# Patient Record
Sex: Male | Born: 1980 | Race: Black or African American | Hispanic: No | Marital: Single | Smoking: Current every day smoker
Health system: Southern US, Community
[De-identification: ages and names within clinical notes are randomized; demographics above are authoritative.]

---

## 2016-02-02 ENCOUNTER — Emergency Department (HOSPITAL_COMMUNITY): Payer: Self-pay

## 2016-02-02 ENCOUNTER — Encounter (HOSPITAL_COMMUNITY): Payer: Self-pay

## 2016-02-02 ENCOUNTER — Emergency Department (HOSPITAL_COMMUNITY)
Admission: EM | Admit: 2016-02-02 | Discharge: 2016-02-02 | Disposition: A | Discharge status: Home / Self Care | Payer: Self-pay | Attending: Emergency Medicine | Admitting: Emergency Medicine

## 2016-02-02 DIAGNOSIS — S63501A Unspecified sprain of right wrist, initial encounter: Secondary | ICD-10-CM | POA: Insufficient documentation

## 2016-02-02 DIAGNOSIS — Y9259 Other trade areas as the place of occurrence of the external cause: Secondary | ICD-10-CM | POA: Insufficient documentation

## 2016-02-02 DIAGNOSIS — M67431 Ganglion, right wrist: Secondary | ICD-10-CM | POA: Insufficient documentation

## 2016-02-02 DIAGNOSIS — X500XXA Overexertion from strenuous movement or load, initial encounter: Secondary | ICD-10-CM | POA: Insufficient documentation

## 2016-02-02 DIAGNOSIS — Y990 Civilian activity done for income or pay: Secondary | ICD-10-CM | POA: Insufficient documentation

## 2016-02-02 DIAGNOSIS — Y939 Activity, unspecified: Secondary | ICD-10-CM | POA: Insufficient documentation

## 2016-02-02 DIAGNOSIS — F17200 Nicotine dependence, unspecified, uncomplicated: Secondary | ICD-10-CM | POA: Insufficient documentation

## 2016-02-02 MED ORDER — MELOXICAM 7.5 MG PO TABS: 7.5000 mg | ORAL_TABLET | Freq: Every day | ORAL | 0 refills | Status: AC

## 2016-02-02 NOTE — ED Triage Notes (Signed)
Per Pt, Pt is coming from home with complaints of aching right wrist pain after about a month. Pt reports working at a [employer]. Denies known injury.

## 2016-02-02 NOTE — ED Notes (Signed)
Declined W/C at D/C and was escorted to lobby by RN. 

## 2016-02-02 NOTE — ED Provider Notes (Signed)
[facility]-EMERGENCY DEPT Provider Note   CSN: [id number] Arrival date & time: 02/02/16  1202  By signing my name below, I, Christy Sartorius, attest that this documentation has been prepared under the direction and in the presence of  [facility], PA-C. Electronically Signed: Christy Sartorius, ED Scribe. 02/02/16. 2:30 PM.  History   Chief Complaint Chief Complaint  Patient presents with  . Wrist Pain   The history is provided by the patient and medical records. No language interpreter was used.   HPI Comments:  Miguel Zhang is a 35 y.o. male who presents to the Emergency Department complaining of acute on chronic pain in his right wrist beginning 3 days ago.  He notes a raised area on his dorsal wrist that prevents full extension of the wrist.  He states that the swelling and pain has appeared intermittently over the last year.  He is taking nothing for his pain.  He reports that his pain is worse after sleeping on it and after work.  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  He denies fevers, other joint pain, IV drug use and STIs.    History reviewed. No pertinent past medical history.  There are no active problems to display for this patient.   History reviewed. No pertinent surgical history.     Home Medications    Prior to Admission medications   Medication Sig Start Date End Date Taking? Authorizing Provider  meloxicam (MOBIC) 7.5 MG tablet Take 1 tablet (7.5 mg total) by mouth daily. 02/02/16   Trixie Dredge, PA-C    Family History No family history on file.  Social History Social History  Substance Use Topics  . Smoking status: Current Every Day Smoker    Packs/day: 0.50  . Smokeless tobacco: Never Used  . Alcohol use No     Allergies   Review of patient's allergies indicates no known allergies.   Review of Systems Review of Systems  Musculoskeletal: Positive for arthralgias and myalgias.  Neurological: Negative  for weakness and numbness.     Physical Exam Updated Vital Signs BP 119/83 (BP Location: Left Arm)   Pulse 79   Temp 98.3 F (36.8 C) (Oral)   Resp 20   Ht 5\' 11"  (1.803 m)   Wt 160 lb (72.6 kg)   SpO2 100%   BMI 22.32 kg/m   Physical Exam  Constitutional: He appears well-developed and well-nourished.  HENT:  Head: Normocephalic and atraumatic.  Neck: Neck supple.  Pulmonary/Chest: Effort normal.  Musculoskeletal:  Right dorsal wrist with tender nodule.  No overlying skin changes or discoloration.  Full active ROM of the wrist and fingers.  No other focal tenderness.  Full active range of motion of all digits, strength 5/5, sensation intact, capillary refill < 2 seconds.     Neurological: He is alert.  Nursing note and vitals reviewed.    ED Treatments / Results   DIAGNOSTIC STUDIES:  Oxygen Saturation is 100% on RA, NML by my interpretation.    COORDINATION OF CARE:  2:30 PM Discussed treatment plan with pt at bedside and pt agreed to plan. Labs (all labs ordered are listed, but only abnormal results are displayed) Labs Reviewed - No data to display  EKG  EKG Interpretation None       Radiology Dg Wrist Complete Right  Result Date: 02/02/2016 CLINICAL DATA:  Pt complains of posterior right wrist pain near the carpals for approximately 1 year but worse  this past week; no known injury EXAM: RIGHT WRIST - COMPLETE 3+ VIEW COMPARISON:  None. FINDINGS: No distal radius or ulnar fracture. Radiocarpal joint is intact. No carpal fracture. No soft tissue abnormality. IMPRESSION: No fracture or dislocation. Electronically Signed   By: Genevive BiStewart  Edmunds M.D.   On: 02/02/2016 14:12    Procedures Procedures (including critical care time)  Medications Ordered in ED Medications - No data to display   Initial Impression / Assessment and Plan / ED Course  I have reviewed the triage vital signs and the nursing notes.  Pertinent labs & imaging results that were  available during my care of the patient were reviewed by me and considered in my medical decision making (see chart for details).  Clinical Course    Afebrile, nontoxic patient with chronic pain in right wrist with what appears to be gangion cyst.   D/C home with velcro wrist splint, NSAIDs, hand surgery follow up.   Discussed result, findings, treatment, and follow up  with patient.  Pt given return precautions.  Pt verbalizes understanding and agrees with plan.       Final Clinical Impressions(s) / ED Diagnoses   Final diagnoses:  Right wrist sprain, initial encounter  Ganglion cyst of wrist, right    New Prescriptions New Prescriptions   MELOXICAM (MOBIC) 7.5 MG TABLET    Take 1 tablet (7.5 mg total) by mouth daily.   I personally performed the services described in this documentation, which was scribed in my presence. The recorded information has been reviewed and is accurate.    Trixie Dredgemily Isabela Nardelli, PA-C 02/02/16 1517    Shaune Pollackameron Isaacs, MD 02/04/16 [phone]

## 2016-02-02 NOTE — Discharge Instructions (Signed)
Read the information below.  Use the prescribed medication as directed.  Please discuss all new medications with your pharmacist.  You may return to the Emergency Department at any time for worsening condition or any new symptoms that concern you.  If you develop uncontrolled pain, weakness or numbness of the extremity, severe discoloration of the skin, or you are unable to move your fingers or wrist, return to the ER for a recheck.    °

## 2018-03-21 IMAGING — DX DG WRIST COMPLETE 3+V*R*
4 series · 4 of 4 positions shown · non-contrast
Comparison: None.

CLINICAL DATA: Pt complains of posterior right wrist pain near the
carpals for approximately 1 year but worse this past week; no known
injury

EXAM:
RIGHT WRIST - COMPLETE 3+ VIEW

[x wrist pa right]
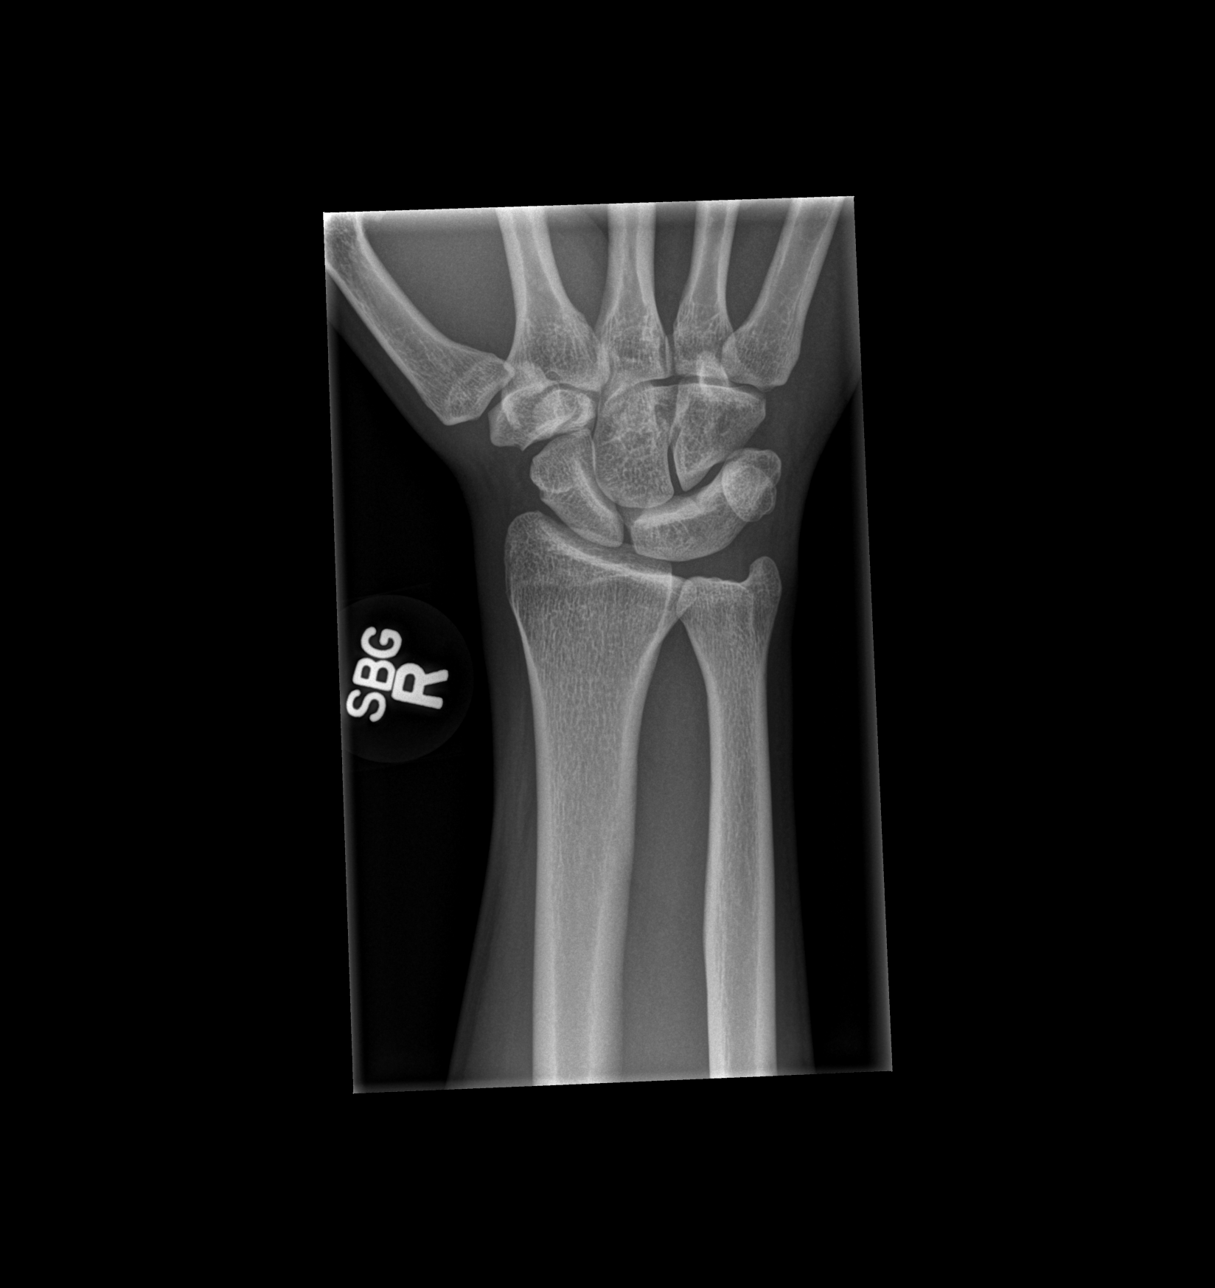

[x wrist obl right]
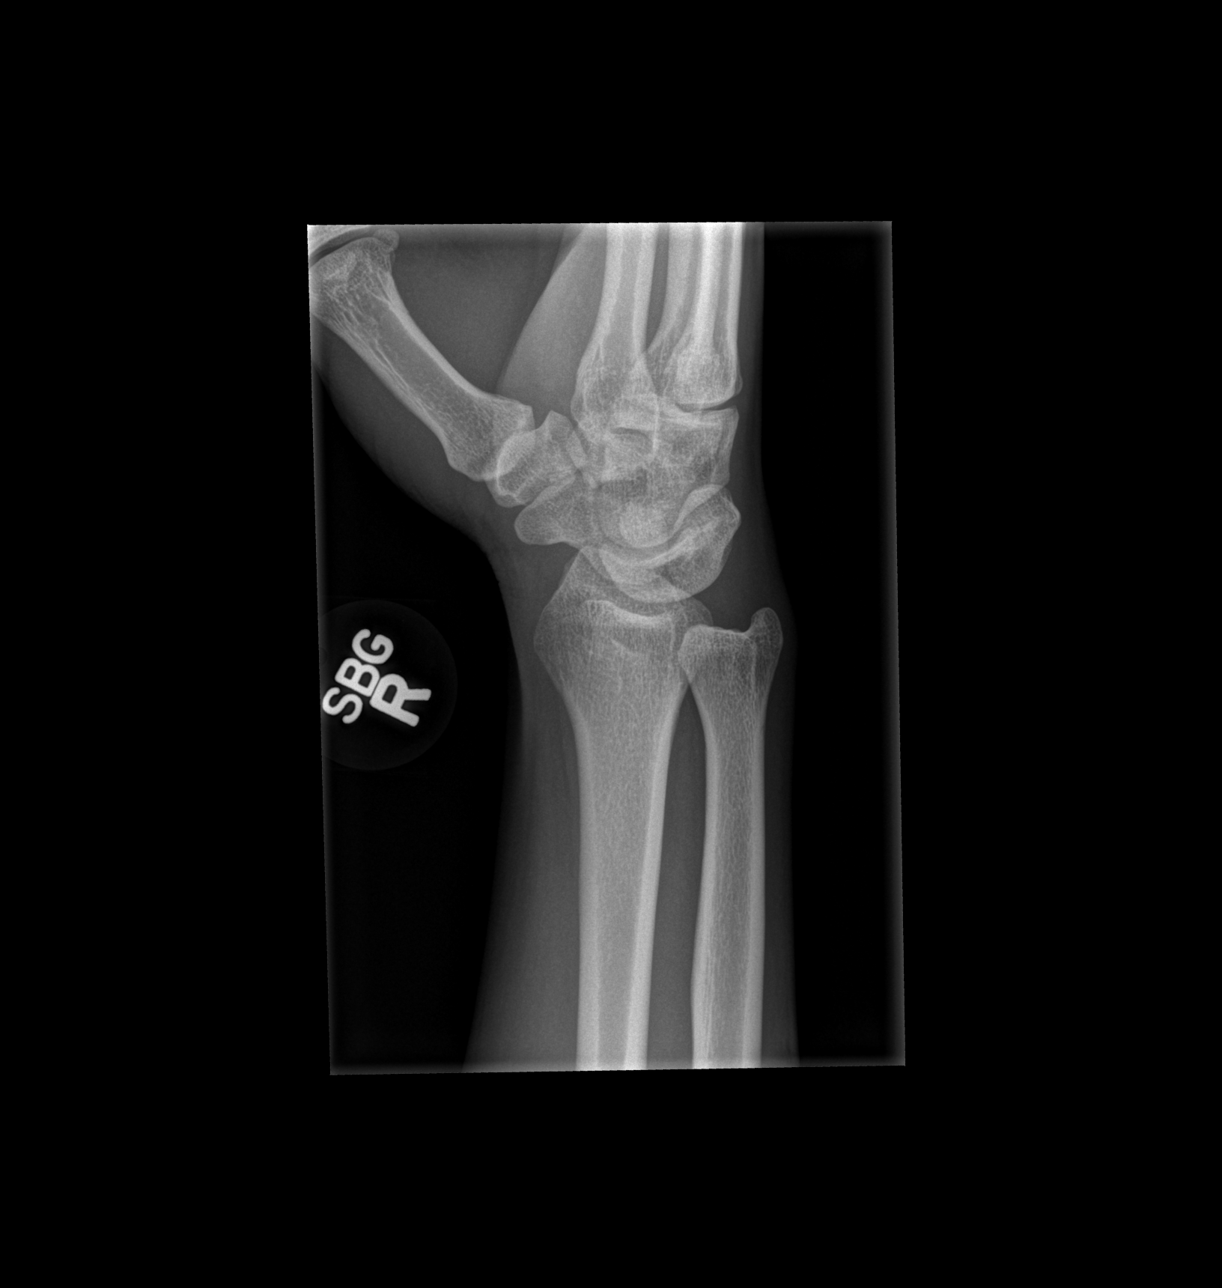

[x wrist lat right]
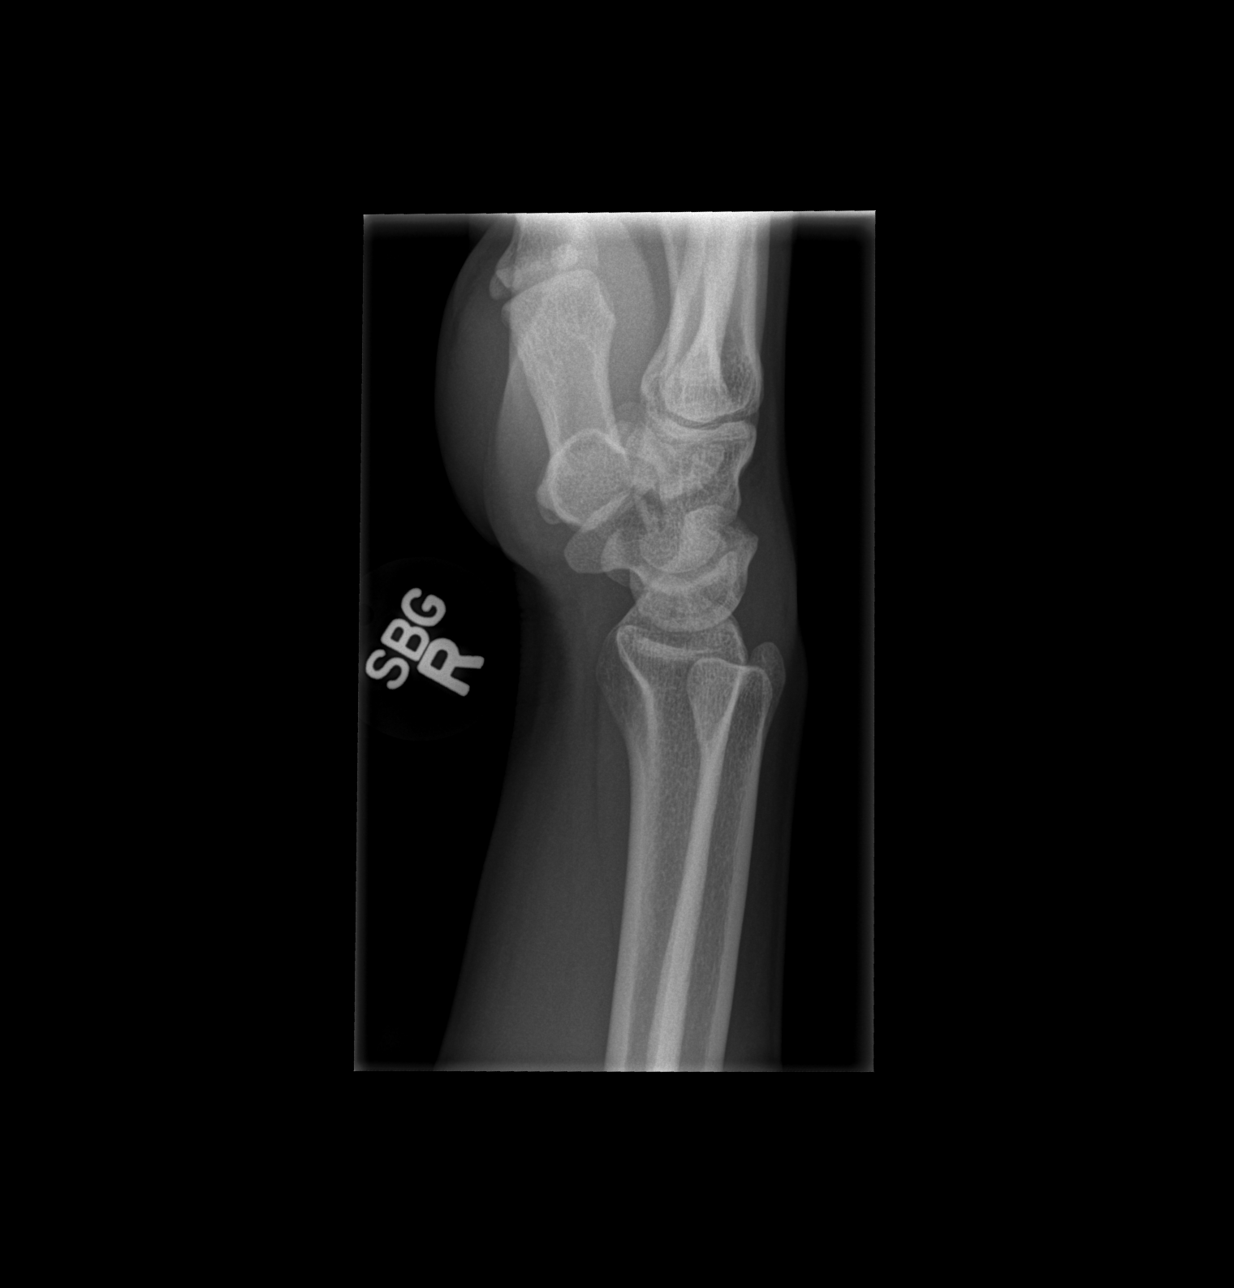

[x wrist navicular view right]
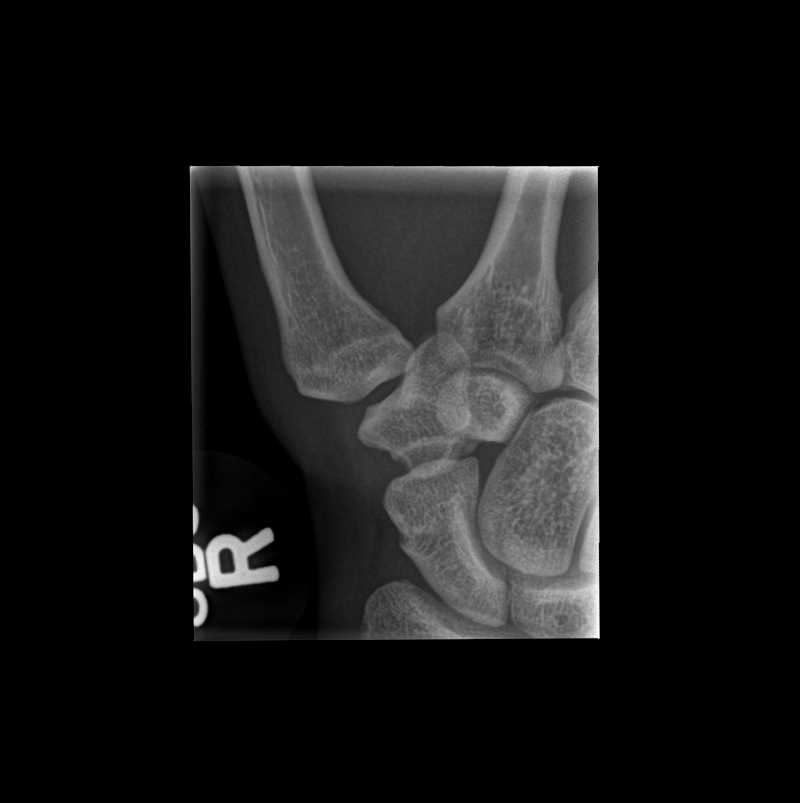

[4 of 4 positions shown; findings below may reference images not displayed]

FINDINGS: No distal radius or ulnar fracture. Radiocarpal joint is intact. No
carpal fracture. No soft tissue abnormality.
IMPRESSION: No fracture or dislocation.
